# Patient Record
Sex: Female | Born: 1965 | Race: White | Hispanic: No
Health system: Southern US, Community
[De-identification: ages and names within clinical notes are randomized; demographics above are authoritative.]

## PROBLEM LIST (undated history)

## (undated) DIAGNOSIS — N809 Endometriosis, unspecified: Secondary | ICD-10-CM

## (undated) HISTORY — PX: OOPHORECTOMY: SHX86

## (undated) HISTORY — PX: OVARY SURGERY: SHX727

## (undated) HISTORY — PX: ABDOMINAL HYSTERECTOMY: SHX81

---

## 2001-09-23 ENCOUNTER — Ambulatory Visit (HOSPITAL_COMMUNITY): Admission: RE | Admit: 2001-09-23 | Discharge: 2001-09-23 | Payer: Self-pay | Admitting: Obstetrics and Gynecology

## 2001-11-23 ENCOUNTER — Inpatient Hospital Stay (HOSPITAL_COMMUNITY): Admission: RE | Admit: 2001-11-23 | Discharge: 2001-11-25 | Payer: Self-pay | Admitting: Obstetrics and Gynecology

## 2002-10-16 ENCOUNTER — Other Ambulatory Visit: Admission: RE | Admit: 2002-10-16 | Discharge: 2002-10-16 | Payer: Self-pay | Admitting: Obstetrics and Gynecology

## 2004-06-05 ENCOUNTER — Ambulatory Visit (HOSPITAL_COMMUNITY): Admission: RE | Admit: 2004-06-05 | Discharge: 2004-06-05 | Payer: Self-pay | Admitting: Obstetrics and Gynecology

## 2004-07-22 ENCOUNTER — Ambulatory Visit: Admission: RE | Admit: 2004-07-22 | Discharge: 2004-07-22 | Payer: Self-pay | Admitting: Gynecology

## 2004-07-29 ENCOUNTER — Inpatient Hospital Stay (HOSPITAL_COMMUNITY): Admission: RE | Admit: 2004-07-29 | Discharge: 2004-07-31 | Payer: Self-pay | Admitting: Obstetrics and Gynecology

## 2006-12-05 ENCOUNTER — Ambulatory Visit: Payer: Self-pay | Admitting: Emergency Medicine

## 2011-11-13 ENCOUNTER — Emergency Department (HOSPITAL_COMMUNITY)
Admission: EM | Admit: 2011-11-13 | Discharge: 2011-11-13 | Disposition: A | Discharge status: Home / Self Care | Payer: BC Managed Care – PPO | Attending: Emergency Medicine | Admitting: Emergency Medicine

## 2011-11-13 ENCOUNTER — Encounter (HOSPITAL_COMMUNITY): Payer: Self-pay | Admitting: *Deleted

## 2011-11-13 DIAGNOSIS — R5381 Other malaise: Secondary | ICD-10-CM | POA: Insufficient documentation

## 2011-11-13 DIAGNOSIS — Z79899 Other long term (current) drug therapy: Secondary | ICD-10-CM | POA: Insufficient documentation

## 2011-11-13 DIAGNOSIS — R109 Unspecified abdominal pain: Secondary | ICD-10-CM | POA: Insufficient documentation

## 2011-11-13 DIAGNOSIS — K5732 Diverticulitis of large intestine without perforation or abscess without bleeding: Secondary | ICD-10-CM | POA: Insufficient documentation

## 2011-11-13 DIAGNOSIS — M549 Dorsalgia, unspecified: Secondary | ICD-10-CM | POA: Insufficient documentation

## 2011-11-13 DIAGNOSIS — R509 Fever, unspecified: Secondary | ICD-10-CM | POA: Insufficient documentation

## 2011-11-13 DIAGNOSIS — K5792 Diverticulitis of intestine, part unspecified, without perforation or abscess without bleeding: Secondary | ICD-10-CM

## 2011-11-13 HISTORY — DX: Endometriosis, unspecified: N80.9

## 2011-11-13 LAB — URINALYSIS, ROUTINE W REFLEX MICROSCOPIC
Bilirubin Urine: NEGATIVE
Glucose, UA: NEGATIVE mg/dL
Ketones, ur: NEGATIVE mg/dL
pH: 6.5 (ref 5.0–8.0)

## 2011-11-13 LAB — DIFFERENTIAL
Basophils Relative: 0 % (ref 0–1)
Eosinophils Absolute: 0 10*3/uL (ref 0.0–0.7)
Lymphs Abs: 1.7 10*3/uL (ref 0.7–4.0)
Neutro Abs: 15.4 10*3/uL — ABNORMAL HIGH (ref 1.7–7.7)
Neutrophils Relative %: 85 % — ABNORMAL HIGH (ref 43–77)

## 2011-11-13 LAB — COMPREHENSIVE METABOLIC PANEL
ALT: 16 U/L (ref 0–35)
Albumin: 3.8 g/dL (ref 3.5–5.2)
Alkaline Phosphatase: 67 U/L (ref 39–117)
Chloride: 98 mEq/L (ref 96–112)
Potassium: 3.6 mEq/L (ref 3.5–5.1)
Sodium: 133 mEq/L — ABNORMAL LOW (ref 135–145)
Total Protein: 7.4 g/dL (ref 6.0–8.3)

## 2011-11-13 LAB — URINE MICROSCOPIC-ADD ON

## 2011-11-13 LAB — CBC
MCH: 30.3 pg (ref 26.0–34.0)
MCHC: 33.2 g/dL (ref 30.0–36.0)
Platelets: 257 10*3/uL (ref 150–400)
RBC: 4.13 MIL/uL (ref 3.87–5.11)

## 2011-11-13 MED ORDER — MORPHINE SULFATE 4 MG/ML IJ SOLN
4.0000 mg | Freq: Once | INTRAMUSCULAR | Status: AC
  Administered 2011-11-13: 4 mg via INTRAVENOUS
  Filled 2011-11-13: qty 1

## 2011-11-13 MED ORDER — CIPROFLOXACIN HCL 500 MG PO TABS
500.0000 mg | ORAL_TABLET | Freq: Once | ORAL | Status: AC
  Administered 2011-11-13: 500 mg via ORAL
  Filled 2011-11-13: qty 1

## 2011-11-13 MED ORDER — METRONIDAZOLE 500 MG PO TABS
500.0000 mg | ORAL_TABLET | Freq: Once | ORAL | Status: AC
  Administered 2011-11-13: 500 mg via ORAL
  Filled 2011-11-13 (×2): qty 1

## 2011-11-13 MED ORDER — SODIUM CHLORIDE 0.9 % IV BOLUS (SEPSIS)
500.0000 mL | Freq: Once | INTRAVENOUS | Status: AC
  Administered 2011-11-13: 500 mL via INTRAVENOUS

## 2011-11-13 MED ORDER — ONDANSETRON HCL 4 MG PO TABS: 4.0000 mg | ORAL_TABLET | Freq: Four times a day (QID) | ORAL | Status: AC

## 2011-11-13 MED ORDER — ONDANSETRON HCL 4 MG/2ML IJ SOLN
4.0000 mg | Freq: Once | INTRAMUSCULAR | Status: AC
  Administered 2011-11-13: 4 mg via INTRAVENOUS
  Filled 2011-11-13: qty 2

## 2011-11-13 MED ORDER — METRONIDAZOLE 500 MG PO TABS: 500.0000 mg | ORAL_TABLET | Freq: Two times a day (BID) | ORAL | Status: AC

## 2011-11-13 MED ORDER — OXYCODONE-ACETAMINOPHEN 5-325 MG PO TABS: 2.0000 | ORAL_TABLET | ORAL | Status: AC | PRN

## 2011-11-13 MED ORDER — CIPROFLOXACIN HCL 500 MG PO TABS: 500.0000 mg | ORAL_TABLET | Freq: Two times a day (BID) | ORAL | Status: AC

## 2011-11-13 NOTE — ED Notes (Signed)
Pt states she was sent to see a Development worker, international [employer].

## 2011-11-13 NOTE — ED Notes (Signed)
Pt being sent by [facility], [place]. Worsening abd pain, fever 100.5, CT Dx sigmoid diverticulitis. Received toradol 60mg  IM at 1500.

## 2011-11-13 NOTE — Discharge Instructions (Signed)
Diverticulitis A diverticulum is a small pouch or sac on the colon. Diverticulosis is the presence of these diverticula on the colon. Diverticulitis is the irritation (inflammation) or infection of diverticula. CAUSES  The colon and its diverticula contain bacteria. If food particles block the tiny opening to a diverticulum, the bacteria inside can grow and cause an increase in pressure. This leads to infection and inflammation and is called diverticulitis. SYMPTOMS   Abdominal pain and tenderness. Usually, the pain is located on the left side of your abdomen. However, it could be located elsewhere.   Fever.   Bloating.   Feeling sick to your stomach (nausea).   Throwing up (vomiting).   Abnormal stools.  DIAGNOSIS  Your caregiver will take a history and perform a physical exam. Since many things can cause abdominal pain, other tests may be necessary. Tests may include:  Blood tests.   Urine tests.   X-ray of the abdomen.   CT scan of the abdomen.  Sometimes, surgery is needed to determine if diverticulitis or other conditions are causing your symptoms. TREATMENT  Most of the time, you can be treated without surgery. Treatment includes:  Resting the bowels by only having liquids for a few days. As you improve, you will need to eat a low-fiber diet.   Intravenous (IV) fluids if you are losing body fluids (dehydrated).   Antibiotic medicines that treat infections may be given.   Pain and nausea medicine, if needed.   Surgery if the inflamed diverticulum has burst.  HOME CARE INSTRUCTIONS   Try a clear liquid diet (broth, tea, or water for as long as directed by your caregiver). You may then gradually begin a low-fiber diet as tolerated. A low-fiber diet is a diet with less than 10 grams of fiber. Choose the foods below to reduce fiber in the diet:   White breads, cereals, rice, and pasta.   Cooked fruits and vegetables or soft fresh fruits and vegetables without the skin.     Ground or well-cooked tender beef, ham, veal, lamb, pork, or poultry.   Eggs and seafood.   After your diverticulitis symptoms have improved, your caregiver may put you on a high-fiber diet. A high-fiber diet includes 14 grams of fiber for every 1000 calories consumed. For a standard 2000 calorie diet, you would need 28 grams of fiber. Follow these diet guidelines to help you increase the fiber in your diet. It is important to slowly increase the amount fiber in your diet to avoid gas, constipation, and bloating.   Choose whole-grain breads, cereals, pasta, and brown rice.   Choose fresh fruits and vegetables with the skin on. Do not overcook vegetables because the more vegetables are cooked, the more fiber is lost.   Choose more nuts, seeds, legumes, dried peas, beans, and lentils.   Look for food products that have greater than 3 grams of fiber per serving on the Nutrition Facts label.   Take all medicine as directed by your caregiver.   If your caregiver has given you a follow-up appointment, it is very important that you go. Not going could result in lasting (chronic) or permanent injury, pain, and disability. If there is any problem keeping the appointment, call to reschedule.  SEEK MEDICAL CARE IF:   Your pain does not improve.   You have a hard time advancing your diet beyond clear liquids.   Your bowel movements do not return to normal.  SEEK IMMEDIATE MEDICAL CARE IF:   Your pain becomes   worse.   You have an oral temperature above 102 F (38.9 C), not controlled by medicine.   You have repeated vomiting.   You have bloody or black, tarry stools.   Symptoms that brought you to your caregiver become worse or are not getting better.  MAKE SURE YOU:   Understand these instructions.   Will watch your condition.   Will get help right away if you are not doing well or get worse.  Document Released: 02/18/2005 Document Revised: 04/30/2011 Document Reviewed:  06/16/2010 [employer] Patient Information 2012 [employer]. 

## 2011-11-13 NOTE — ED Notes (Signed)
C/o lower abd pain and lower back pain. Had CT by [facility] today.

## 2011-11-13 NOTE — ED Provider Notes (Signed)
History     CSN: [id number]  Arrival date & time 11/13/11  1635   First MD Initiated Contact with Patient 11/13/11 1720      Chief Complaint  Patient presents with  . Abdominal Pain  . Back Pain    (Consider location/radiation/quality/duration/timing/severity/associated sxs/prior treatment) Patient is a 46 y.o. female presenting with abdominal pain and back pain. The history is provided by the patient.  Abdominal Pain The primary symptoms of the illness include abdominal pain, fever and fatigue. The primary symptoms of the illness do not include shortness of breath, nausea, vomiting or diarrhea. The current episode started yesterday. The onset of the illness was gradual. The problem has been gradually worsening.  Additional symptoms associated with the illness include back pain. Symptoms associated with the illness do not include chills, diaphoresis, constipation, hematuria or frequency.  Back Pain  Associated symptoms include a fever and abdominal pain. Pertinent negatives include no chest pain, no numbness, no headaches and no weakness.    Past Medical History  Diagnosis Date  . Endometriosis     Past Surgical History  Procedure Date  . Abdominal hysterectomy   . Oophorectomy   . Ovary surgery     History reviewed. No pertinent family history.  History  Substance Use Topics  . Smoking status: Not on file  . Smokeless tobacco: Not on file  . Alcohol Use: Yes     social    OB History    Grav Para Term Preterm Abortions TAB SAB Ect Mult Living                  Review of Systems  Constitutional: Positive for fever and fatigue. Negative for chills and diaphoresis.  HENT: Negative for congestion, rhinorrhea and sneezing.   Eyes: Negative.   Respiratory: Negative for cough, chest tightness and shortness of breath.   Cardiovascular: Negative for chest pain and leg swelling.  Gastrointestinal: Positive for abdominal pain. Negative for nausea, vomiting, diarrhea,  constipation and blood in stool.  Genitourinary: Negative for frequency, hematuria, flank pain and difficulty urinating.  Musculoskeletal: Positive for back pain. Negative for arthralgias.  Skin: Negative for rash.  Neurological: Negative for dizziness, speech difficulty, weakness, numbness and headaches.    Allergies  Sulfa antibiotics  Home Medications   Current Outpatient Rx  Name Route Sig Dispense Refill  . ATENOLOL 50 MG PO TABS Oral Take 50 mg by mouth daily.    Marland Kitchen VITAMIN D3 1000 UNITS PO CAPS Oral Take 3,000 Units by mouth at bedtime.    . CO Q-10 200 MG PO CAPS Oral Take 1 capsule by mouth at bedtime.    Marland Kitchen ESTRADIOL 0.5 MG PO TABS Oral Take 0.5 mg by mouth daily.    Marland Kitchen LORATADINE 10 MG PO TABS Oral Take 10 mg by mouth daily.    Marland Kitchen MELATONIN 5 MG PO CAPS Oral Take 1 capsule by mouth at bedtime.    Marland Kitchen NAPROXEN SODIUM 550 MG PO TABS Oral Take 550 mg by mouth.    . OIL OF OREGANO 1500 MG PO CAPS Oral Take 2 capsules by mouth.    Marland Kitchen OVER THE COUNTER MEDICATION Oral Take 1 tablet by mouth at bedtime. Calcium-magnesium-potassium supplement.    Marland Kitchen OVER THE COUNTER MEDICATION Oral Take 1 tablet by mouth at bedtime. Bromelain and Chief Operating Officer.    Marland Kitchen CIPROFLOXACIN HCL 500 MG PO TABS Oral Take 1 tablet (500 mg total) by mouth every 12 (twelve) hours. 20 tablet 0  .  METRONIDAZOLE 500 MG PO TABS Oral Take 1 tablet (500 mg total) by mouth 2 (two) times daily. 14 tablet 0  . ONDANSETRON HCL 4 MG PO TABS Oral Take 1 tablet (4 mg total) by mouth every 6 (six) hours. 12 tablet 0  . OXYCODONE-ACETAMINOPHEN 5-325 MG PO TABS Oral Take 2 tablets by mouth every 4 (four) hours as needed for pain. 15 tablet 0    BP 113/68  Pulse 96  Temp 99 F (37.2 C) (Oral)  Resp 18  Ht 5\' 3"  (1.6 m)  Wt 180 lb (81.647 kg)  BMI 31.89 kg/m2  SpO2 97%  Physical Exam  Constitutional: She is oriented to person, place, and time. She appears well-developed and well-nourished.  HENT:  Head: Normocephalic and  atraumatic.  Eyes: Pupils are equal, round, and reactive to light.  Neck: Normal range of motion. Neck supple.  Cardiovascular: Normal rate, regular rhythm and normal heart sounds.   Pulmonary/Chest: Effort normal and breath sounds normal. No respiratory distress. She has no wheezes. She has no rales. She exhibits no tenderness.  Abdominal: Soft. Bowel sounds are normal. There is tenderness (moderate tenderness to lower abdomen). There is no rebound and no guarding.  Musculoskeletal: Normal range of motion. She exhibits no edema.  Lymphadenopathy:    She has no cervical adenopathy.  Neurological: She is alert and oriented to person, place, and time.  Skin: Skin is warm and dry. No rash noted.  Psychiatric: She has a normal mood and affect.    ED Course  Procedures (including critical care time)  Results for orders placed during the hospital encounter of 11/13/11  CBC      Component Value Range   WBC 18.0 (*) 4.0 - 10.5 K/uL   RBC 4.13  3.87 - 5.11 MIL/uL   Hemoglobin 12.5  12.0 - 15.0 g/dL   HCT 41.3  24.4 - 01.0 %   MCV 91.3  78.0 - 100.0 fL   MCH 30.3  26.0 - 34.0 pg   MCHC 33.2  30.0 - 36.0 g/dL   RDW 27.2  53.6 - 64.4 %   Platelets 257  150 - 400 K/uL  DIFFERENTIAL      Component Value Range   Neutrophils Relative 85 (*) 43 - 77 %   Neutro Abs 15.4 (*) 1.7 - 7.7 K/uL   Lymphocytes Relative 10 (*) 12 - 46 %   Lymphs Abs 1.7  0.7 - 4.0 K/uL   Monocytes Relative 5  3 - 12 %   Monocytes Absolute 0.8  0.1 - 1.0 K/uL   Eosinophils Relative 0  0 - 5 %   Eosinophils Absolute 0.0  0.0 - 0.7 K/uL   Basophils Relative 0  0 - 1 %   Basophils Absolute 0.0  0.0 - 0.1 K/uL  COMPREHENSIVE METABOLIC PANEL      Component Value Range   Sodium 133 (*) 135 - 145 mEq/L   Potassium 3.6  3.5 - 5.1 mEq/L   Chloride 98  96 - 112 mEq/L   CO2 23  19 - 32 mEq/L   Glucose, Bld 176 (*) 70 - 99 mg/dL   BUN 16  6 - 23 mg/dL   Creatinine, Ser 0.34  0.50 - 1.10 mg/dL   Calcium 9.4  8.4 - 74.2  mg/dL   Total Protein 7.4  6.0 - 8.3 g/dL   Albumin 3.8  3.5 - 5.2 g/dL   AST 17  0 - 37 U/L   ALT 16  0 - 35 U/L   Alkaline Phosphatase 67  39 - 117 U/L   Total Bilirubin 0.7  0.3 - 1.2 mg/dL   GFR calc non Af Amer 80 (*) >90 mL/min   GFR calc Af Amer >90  >90 mL/min   No results found.   No results found.   1. Diverticulitis       MDM  Pt well appearing with diverticulitis on CT.  Reviewed CT results.  No evidence of perforation/abscess.  Will need f/u with PMD, likely colonscopy in future to r/o any cancer type lesions.  Pt has no vomiting.  Will d/c on abx, pain meds.  Advised to f/u with PMD next week or return here for worsening symptoms.  Also advised will need to have her blood sugar rechecked.  Advised to add fiber supplement to her diet.        Rolan Bucco, MD 11/13/11 1929

## 2014-03-22 ENCOUNTER — Other Ambulatory Visit: Payer: Self-pay | Admitting: Obstetrics and Gynecology

## 2014-03-26 LAB — CYTOLOGY - PAP

## 2015-03-06 ENCOUNTER — Other Ambulatory Visit: Payer: Self-pay

## 2015-03-06 DIAGNOSIS — K2100 Gastro-esophageal reflux disease with esophagitis, without bleeding: Secondary | ICD-10-CM

## 2015-03-11 ENCOUNTER — Other Ambulatory Visit: Payer: Self-pay

## 2015-03-11 DIAGNOSIS — K2100 Gastro-esophageal reflux disease with esophagitis, without bleeding: Secondary | ICD-10-CM

## 2015-03-11 MED ORDER — OMEPRAZOLE 20 MG PO CPDR: 20.0000 mg | DELAYED_RELEASE_CAPSULE | Freq: Every day | ORAL | Status: AC

## 2015-03-11 NOTE — Telephone Encounter (Signed)
Last OV 10/2013 

## 2015-12-13 ENCOUNTER — Ambulatory Visit: Payer: Managed Care, Other (non HMO) | Attending: Neurology

## 2015-12-13 DIAGNOSIS — R0683 Snoring: Secondary | ICD-10-CM | POA: Diagnosis present

## 2015-12-13 DIAGNOSIS — G4733 Obstructive sleep apnea (adult) (pediatric): Secondary | ICD-10-CM | POA: Insufficient documentation

## 2016-04-22 ENCOUNTER — Other Ambulatory Visit: Payer: Self-pay | Admitting: Family Medicine

## 2016-04-22 DIAGNOSIS — Z1231 Encounter for screening mammogram for malignant neoplasm of breast: Secondary | ICD-10-CM

## 2016-05-14 ENCOUNTER — Ambulatory Visit
Admission: RE | Admit: 2016-05-14 | Discharge: 2016-05-14 | Disposition: A | Discharge status: Home / Self Care | Payer: Managed Care, Other (non HMO) | Source: Ambulatory Visit | Attending: Family Medicine | Admitting: Family Medicine

## 2016-05-14 DIAGNOSIS — Z1231 Encounter for screening mammogram for malignant neoplasm of breast: Secondary | ICD-10-CM | POA: Diagnosis not present

## 2016-05-20 ENCOUNTER — Other Ambulatory Visit: Payer: Self-pay | Admitting: *Deleted

## 2016-05-20 ENCOUNTER — Inpatient Hospital Stay
Admission: RE | Admit: 2016-05-20 | Discharge: 2016-05-20 | Disposition: A | Discharge status: Home / Self Care | Payer: Self-pay | Source: Ambulatory Visit | Attending: *Deleted | Admitting: *Deleted

## 2016-05-20 DIAGNOSIS — Z9289 Personal history of other medical treatment: Secondary | ICD-10-CM

## 2017-05-11 ENCOUNTER — Other Ambulatory Visit: Payer: Self-pay | Admitting: Family Medicine

## 2017-05-11 DIAGNOSIS — Z1231 Encounter for screening mammogram for malignant neoplasm of breast: Secondary | ICD-10-CM

## 2017-06-03 ENCOUNTER — Ambulatory Visit
Admission: RE | Admit: 2017-06-03 | Discharge: 2017-06-03 | Disposition: A | Discharge status: Home / Self Care | Payer: Managed Care, Other (non HMO) | Source: Ambulatory Visit | Attending: Family Medicine | Admitting: Family Medicine

## 2017-06-03 ENCOUNTER — Encounter (INDEPENDENT_AMBULATORY_CARE_PROVIDER_SITE_OTHER): Payer: Self-pay

## 2017-06-03 DIAGNOSIS — Z1231 Encounter for screening mammogram for malignant neoplasm of breast: Secondary | ICD-10-CM | POA: Diagnosis not present

## 2018-05-04 ENCOUNTER — Other Ambulatory Visit: Payer: Self-pay | Admitting: Family Medicine

## 2019-01-28 IMAGING — MG MM DIGITAL SCREENING BILAT W/ CAD
4 series · 4 of 4 positions shown · non-contrast
Comparison: Previous exam(s).

CLINICAL DATA: Screening.

EXAM:
DIGITAL SCREENING BILATERAL MAMMOGRAM WITH CAD

[R CC]
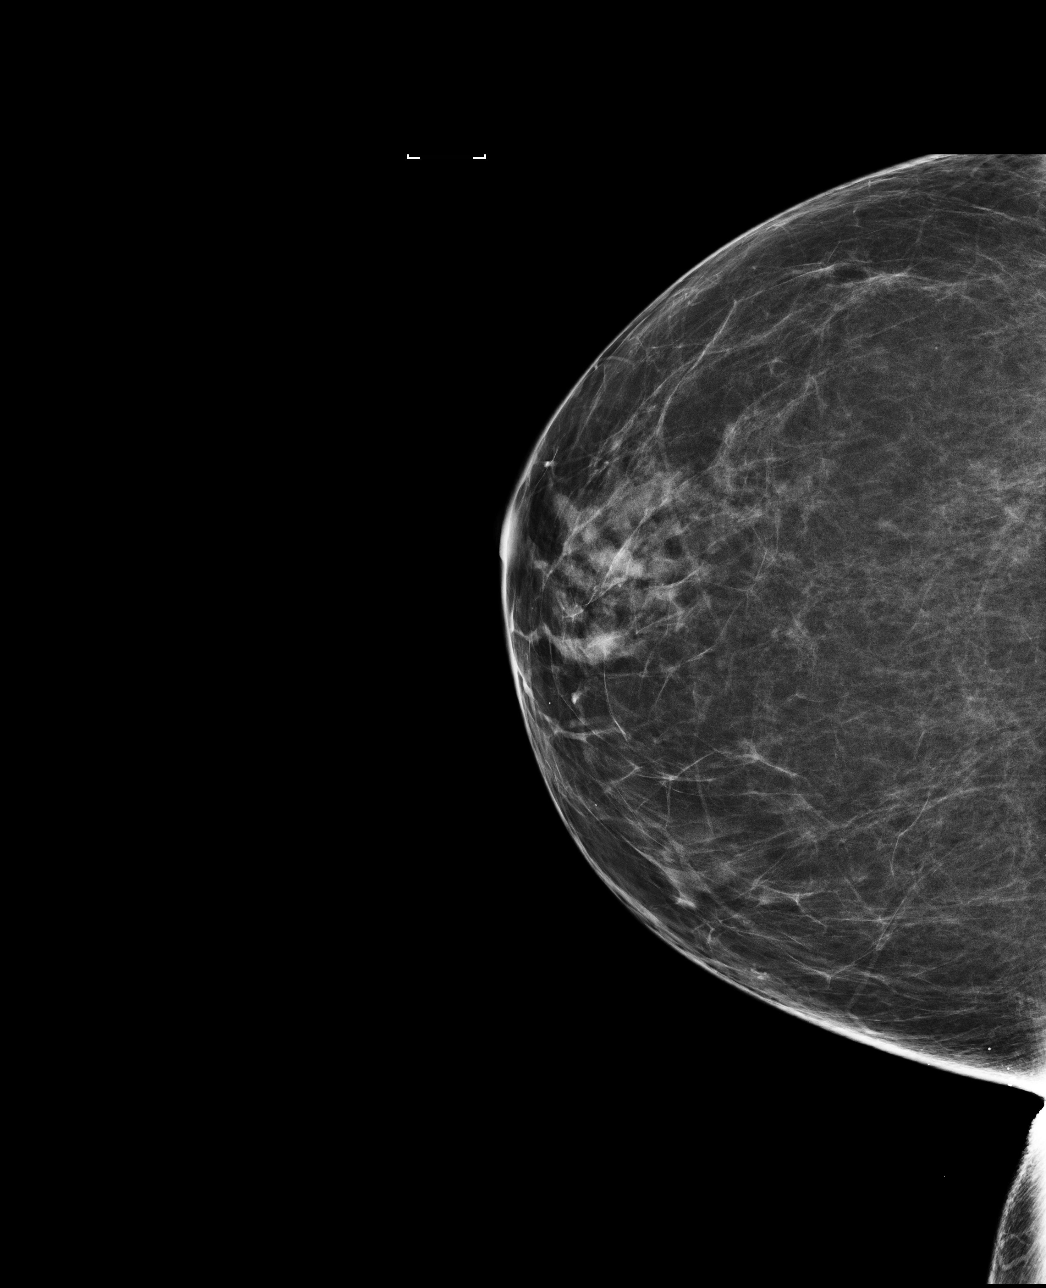

[L CC]
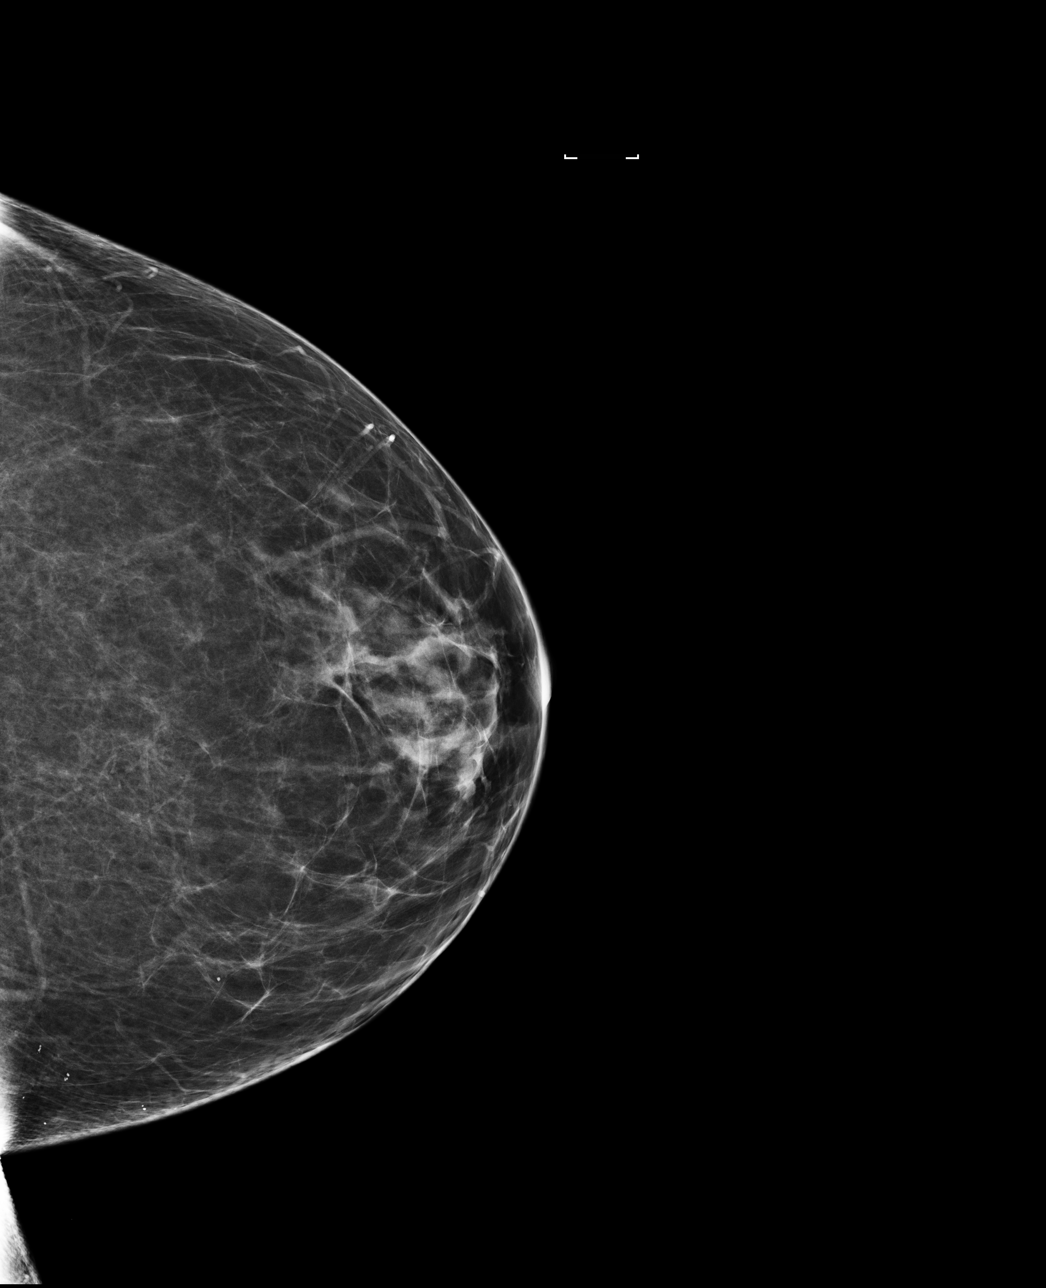

[R MLO]
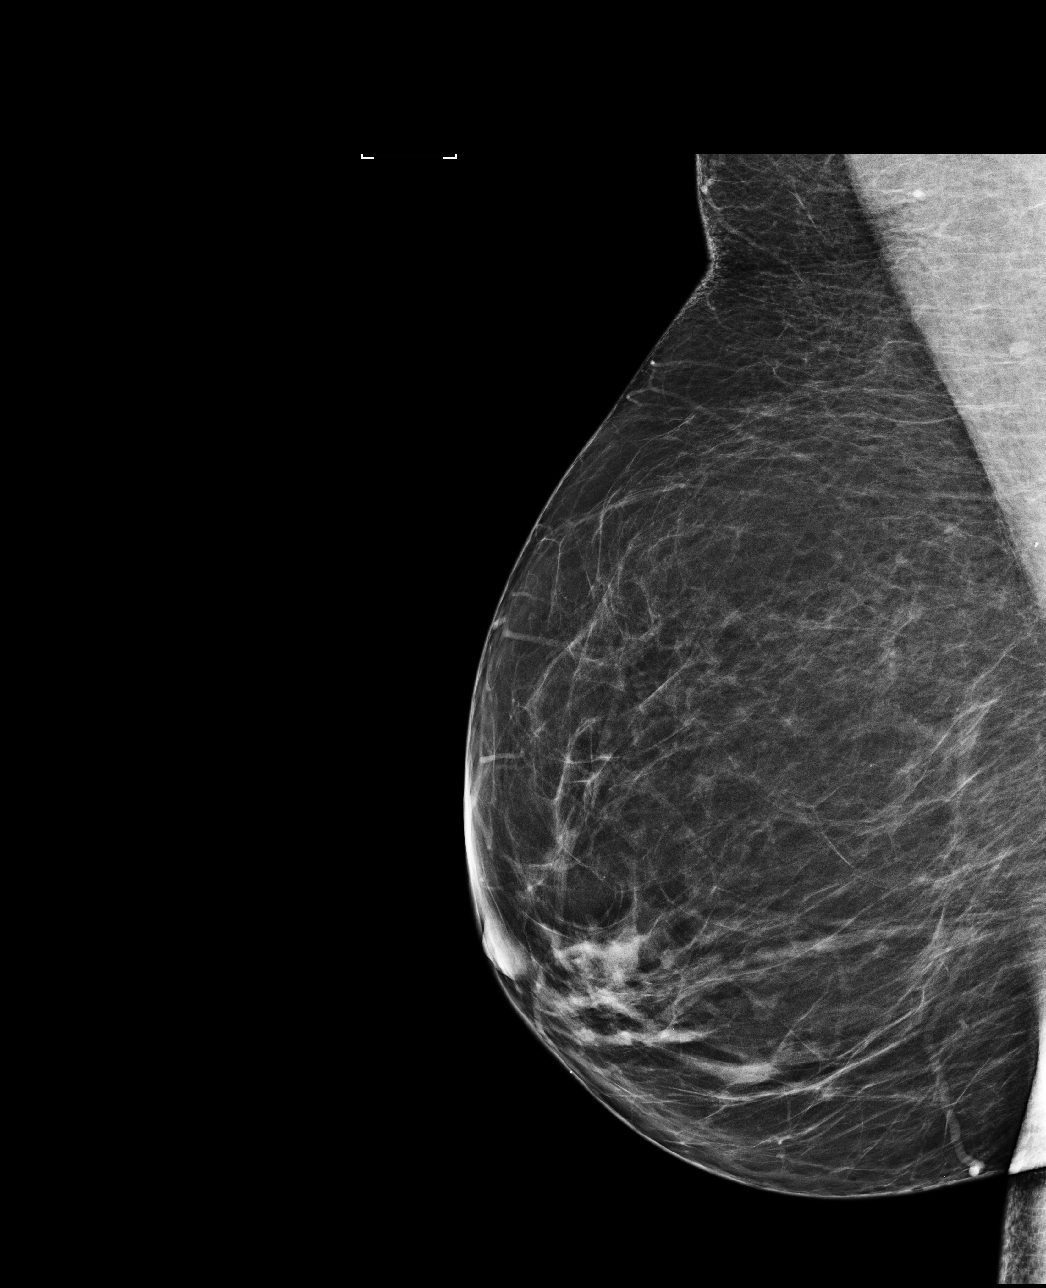

[L MLO]
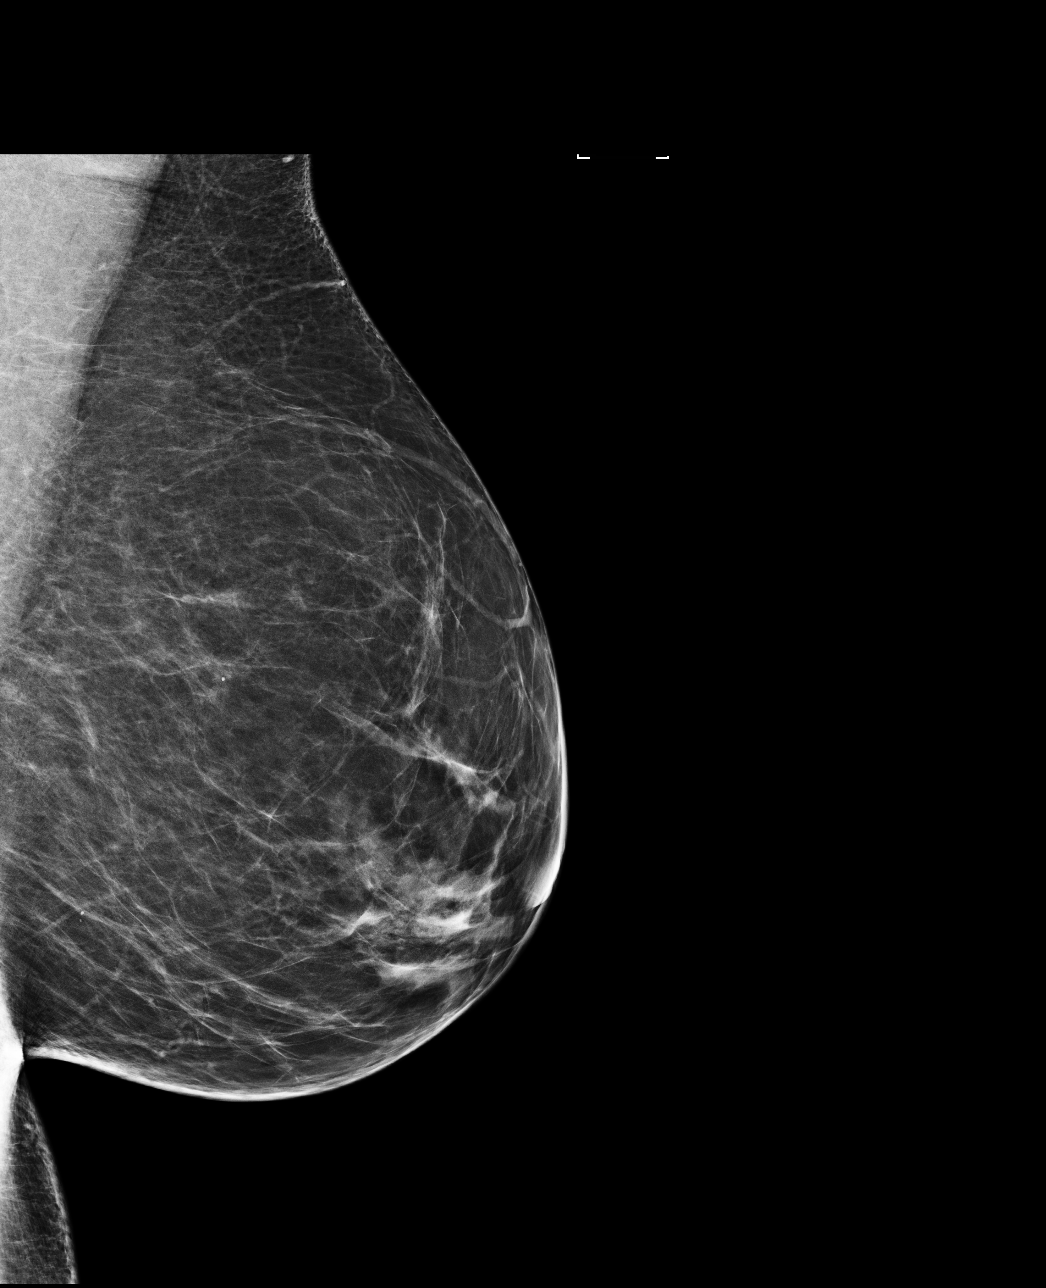

[4 of 4 positions shown; findings below may reference images not displayed]

ACR Breast Density Category b: There are scattered areas of
fibroglandular density.
FINDINGS: There are no findings suspicious for malignancy. Images were
processed with CAD.
IMPRESSION: No mammographic evidence of malignancy. A result letter of this
screening mammogram will be mailed directly to the patient.

RECOMMENDATION:
Screening mammogram in one year. (Code:AS-G-LCT)

BI-RADS CATEGORY  1: Negative.
